# Patient Record
Sex: Male | Born: 1975 | Race: Black or African American | Hispanic: No | Marital: Single | Smoking: Never smoker
Health system: Southern US, Community
[De-identification: ages and names within clinical notes are randomized; demographics above are authoritative.]

---

## 2013-08-24 ENCOUNTER — Encounter (HOSPITAL_COMMUNITY): Payer: Self-pay | Admitting: Emergency Medicine

## 2013-08-24 ENCOUNTER — Emergency Department (INDEPENDENT_AMBULATORY_CARE_PROVIDER_SITE_OTHER): Payer: BC Managed Care – PPO

## 2013-08-24 ENCOUNTER — Emergency Department (HOSPITAL_COMMUNITY)
Admission: EM | Admit: 2013-08-24 | Discharge: 2013-08-24 | Disposition: A | Discharge status: Home / Self Care | Payer: BC Managed Care – PPO | Source: Home / Self Care | Attending: Family Medicine | Admitting: Family Medicine

## 2013-08-24 DIAGNOSIS — M549 Dorsalgia, unspecified: Secondary | ICD-10-CM

## 2013-08-24 LAB — POCT URINALYSIS DIP (DEVICE)
BILIRUBIN URINE: NEGATIVE
Glucose, UA: NEGATIVE mg/dL
KETONES UR: NEGATIVE mg/dL
LEUKOCYTES UA: NEGATIVE
Nitrite: NEGATIVE
PH: 5.5 (ref 5.0–8.0)
Protein, ur: NEGATIVE mg/dL
Specific Gravity, Urine: 1.015 (ref 1.005–1.030)
Urobilinogen, UA: 0.2 mg/dL (ref 0.0–1.0)

## 2013-08-24 MED ORDER — IBUPROFEN 800 MG PO TABS: 800.0000 mg | ORAL_TABLET | Freq: Three times a day (TID) | ORAL | Status: AC

## 2013-08-24 MED ORDER — KETOROLAC TROMETHAMINE 60 MG/2ML IM SOLN
60.0000 mg | Freq: Once | INTRAMUSCULAR | Status: AC
  Administered 2013-08-24: 60 mg via INTRAMUSCULAR

## 2013-08-24 MED ORDER — CYCLOBENZAPRINE HCL 10 MG PO TABS: 10.0000 mg | ORAL_TABLET | Freq: Two times a day (BID) | ORAL | Status: AC | PRN

## 2013-08-24 MED ORDER — KETOROLAC TROMETHAMINE 60 MG/2ML IM SOLN
INTRAMUSCULAR | Status: AC
  Filled 2013-08-24: qty 2

## 2013-08-24 MED ORDER — HYDROCODONE-ACETAMINOPHEN 5-325 MG PO TABS: 2.0000 | ORAL_TABLET | ORAL | Status: AC | PRN

## 2013-08-24 NOTE — ED Notes (Signed)
C/o  Lower back pain since Wednesday.  Denies any known injury and urinary symptoms.  Pt has tried massages with no relief.

## 2013-08-24 NOTE — ED Provider Notes (Signed)
CSN: [id number]     Arrival date & time 08/24/13  1159 History   First MD Initiated Contact with Patient 08/24/13 1321     Chief Complaint  Patient presents with  . Back Pain   (Consider location/radiation/quality/duration/timing/severity/associated sxs/prior Treatment) Patient is a 38 y.o. male presenting with back pain. The history is provided by the patient. No language interpreter was used.  Back Pain Location:  Generalized Quality:  Aching Pain severity:  Moderate Pain is:  Same all the time Duration:  6 days Timing:  Constant Progression:  Worsening Chronicity:  New Relieved by:  Nothing Worsened by:  Nothing tried Ineffective treatments:  None tried Associated symptoms: no abdominal pain     History reviewed. No pertinent past medical history. History reviewed. No pertinent past surgical history. History reviewed. No pertinent family history. History  Substance Use Topics  . Smoking status: Never Smoker   . Smokeless tobacco: Not on file  . Alcohol Use: No    Review of Systems  Gastrointestinal: Negative for abdominal pain.  Musculoskeletal: Positive for back pain.  All other systems reviewed and are negative.   Allergies  Review of patient's allergies indicates no known allergies.  Home Medications   Prior to Admission medications   Medication Sig Start Date End Date Taking? Authorizing Provider  lisinopril-hydrochlorothiazide (PRINZIDE,ZESTORETIC) 20-12.5 MG per tablet Take 1 tablet by mouth daily.   Yes Historical Provider, MD   BP 150/112  Pulse 79  Temp(Src) 98.7 F (37.1 C) (Oral)  Resp 12  SpO2 100% Physical Exam  Constitutional: He appears well-developed and well-nourished.  HENT:  Head: Normocephalic.  Left Ear: External ear normal.  Eyes: Pupils are equal, round, and reactive to light.  Neck: Normal range of motion.  Cardiovascular: Normal rate.   Pulmonary/Chest: Effort normal.  Abdominal: Soft.  Musculoskeletal: Normal range of  motion.  Neurological: He is alert.  Skin: Skin is warm.    ED Course  Procedures (including critical care time) Labs Review Labs Reviewed  POCT URINALYSIS DIP (DEVICE) - Abnormal; Notable for the following:    Hgb urine dipstick TRACE (*)    All other components within normal limits    Imaging Review Dg Lumbar Spine Complete  08/24/2013   CLINICAL DATA:  Lower back pain.  No known injury.  EXAM: LUMBAR SPINE - COMPLETE 4+ VIEW  COMPARISON:  None.  FINDINGS: There is no evidence of lumbar spine fracture. Alignment is normal. Intervertebral disc spaces are maintained. Minimal spurring surrounding the T10-11 disc space as well involving the anterior superior and anterior inferior aspect of the L4 vertebra.  IMPRESSION: Alignment is normal.  Intervertebral disc spaces are maintained.  Minimal spurring surrounding the T10-11 disc space as well involving the anterior superior and anterior inferior aspect of the L4 vertebra.   Electronically Signed   By: Bridgett Larsson M.D.   On: 08/24/2013 14:23     MDM   1. Back pain    Ibuprofen, flexeril. Hydrocodone    Elson Areas, PA-C 08/24/13 1520

## 2013-08-24 NOTE — Discharge Instructions (Signed)
Back Pain, Adult Low back pain is very common. About 1 in 5 people have back pain.The cause of low back pain is rarely dangerous. The pain often gets better over time.About half of people with a sudden onset of back pain feel better in just 2 weeks. About 8 in 10 people feel better by 6 weeks.  CAUSES Some common causes of back pain include:  Strain of the muscles or ligaments supporting the spine.  Wear and tear (degeneration) of the spinal discs.  Arthritis.  Direct injury to the back. DIAGNOSIS Most of the time, the direct cause of low back pain is not known.However, back pain can be treated effectively even when the exact cause of the pain is unknown.Answering your caregiver's questions about your overall health and symptoms is one of the most accurate ways to make sure the cause of your pain is not dangerous. If your caregiver needs more information, he or she may order lab work or imaging tests (X-rays or MRIs).However, even if imaging tests show changes in your back, this usually does not require surgery. HOME CARE INSTRUCTIONS For many people, back pain returns.Since low back pain is rarely dangerous, it is often a condition that people can learn to manageon their own.   Remain active. It is stressful on the back to sit or stand in one place. Do not sit, drive, or stand in one place for more than 30 minutes at a time. Take short walks on level surfaces as soon as pain allows.Try to increase the length of time you walk each day.  Do not stay in bed.Resting more than 1 or 2 days can delay your recovery.  Do not avoid exercise or work.Your body is made to move.It is not dangerous to be active, even though your back may hurt.Your back will likely heal faster if you return to being active before your pain is gone.  Pay attention to your body when you bend and lift. Many people have less discomfortwhen lifting if they bend their knees, keep the load close to their bodies,and  avoid twisting. Often, the most comfortable positions are those that put less stress on your recovering back.  Find a comfortable position to sleep. Use a firm mattress and lie on your side with your knees slightly bent. If you lie on your back, put a pillow under your knees.  Only take over-the-counter or prescription medicines as directed by your caregiver. Over-the-counter medicines to reduce pain and inflammation are often the most helpful.Your caregiver may prescribe muscle relaxant drugs.These medicines help dull your pain so you can more quickly return to your normal activities and healthy exercise.  Put ice on the injured area.  Put ice in a plastic bag.  Place a towel between your skin and the bag.  Leave the ice on for 15-20 minutes, 03-04 times a day for the first 2 to 3 days. After that, ice and heat may be alternated to reduce pain and spasms.  Ask your caregiver about trying back exercises and gentle massage. This may be of some benefit.  Avoid feeling anxious or stressed.Stress increases muscle tension and can worsen back pain.It is important to recognize when you are anxious or stressed and learn ways to manage it.Exercise is a great option. SEEK MEDICAL CARE IF:  You have pain that is not relieved with rest or medicine.  You have pain that does not improve in 1 week.  You have new symptoms.  You are generally not feeling well. SEEK   IMMEDIATE MEDICAL CARE IF:   You have pain that radiates from your back into your legs.  You develop new bowel or bladder control problems.  You have unusual weakness or numbness in your arms or legs.  You develop nausea or vomiting.  You develop abdominal pain.  You feel faint. Document Released: 03/05/2005 Document Revised: 09/04/2011 Document Reviewed: 07/24/2010 [employer] Patient Information 2014 [employer].  

## 2013-08-25 NOTE — ED Provider Notes (Signed)
Medical screening examination/treatment/procedure(s) were performed by non-physician practitioner and as supervising physician I was immediately available for consultation/collaboration.  Gennavieve Huq, M.D.  Dejanique Ruehl C Ethyn Schetter, MD 08/25/13 0737 

## 2015-02-19 IMAGING — CR DG LUMBAR SPINE COMPLETE 4+V
5 series · 5 of 5 positions shown · non-contrast
Comparison: None.

CLINICAL DATA: Lower back pain.  No known injury.

EXAM:
LUMBAR SPINE - COMPLETE 4+ VIEW

[view not recorded (1 of 5)]
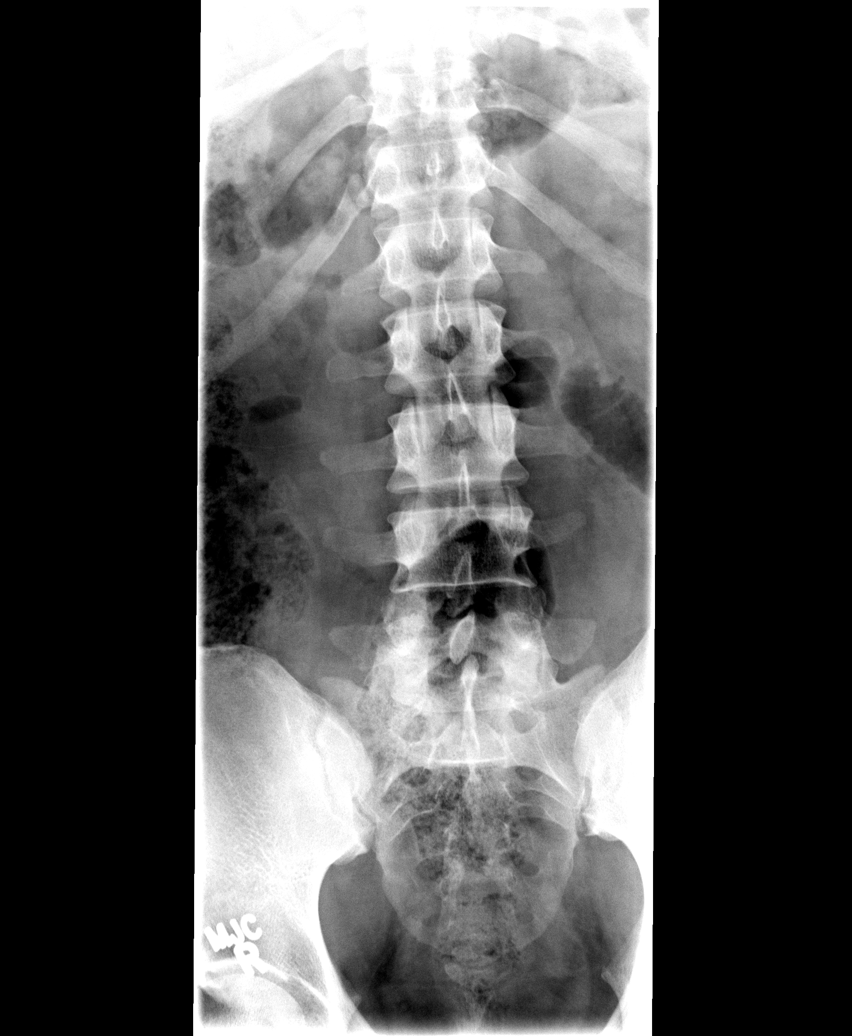

[view not recorded (2 of 5)]
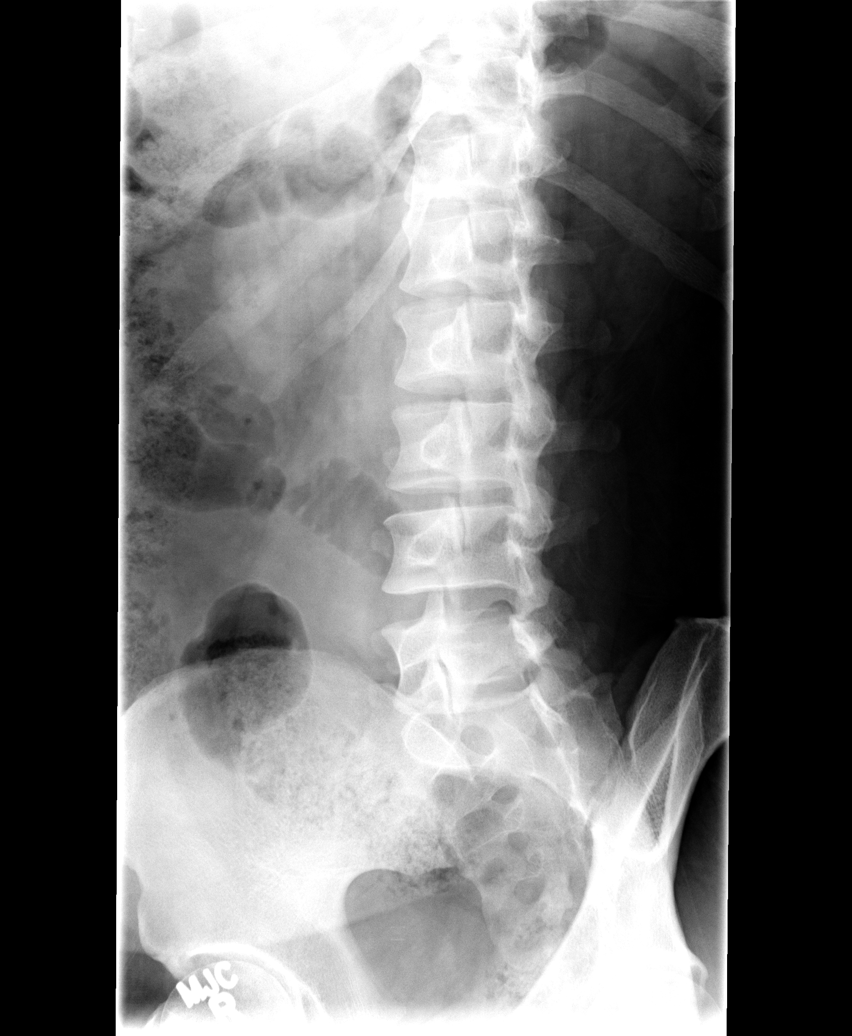

[view not recorded (3 of 5)]
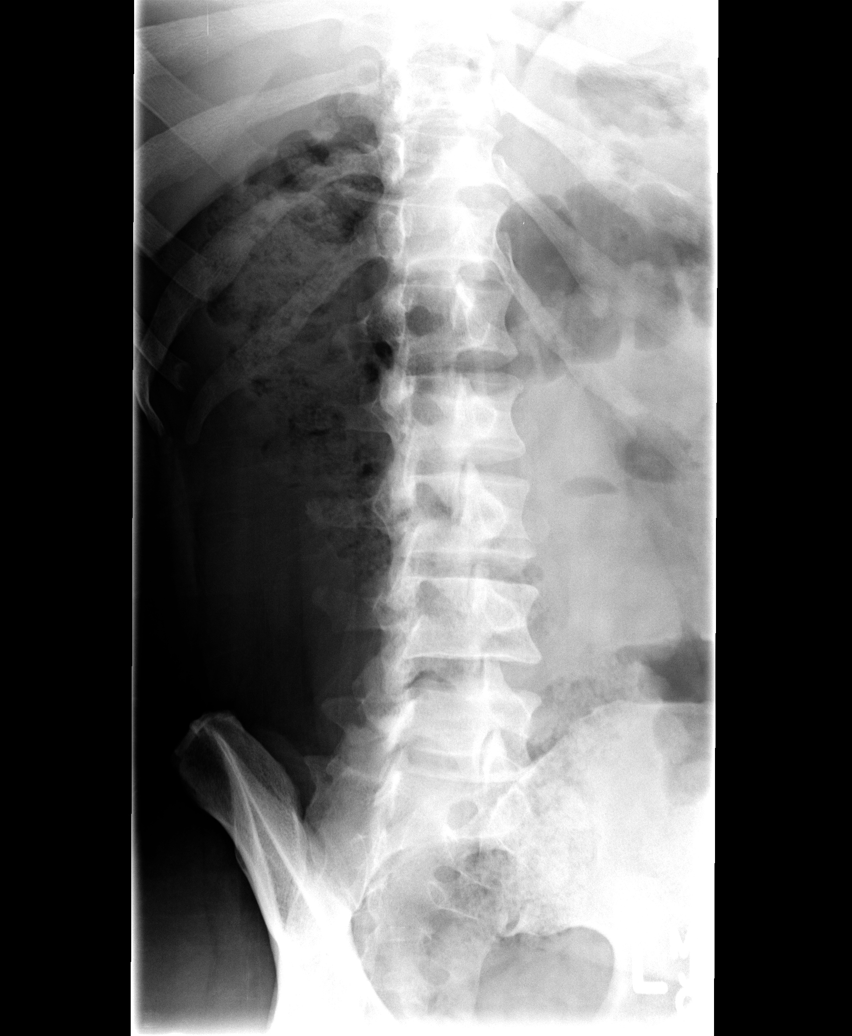

[view not recorded (4 of 5)]
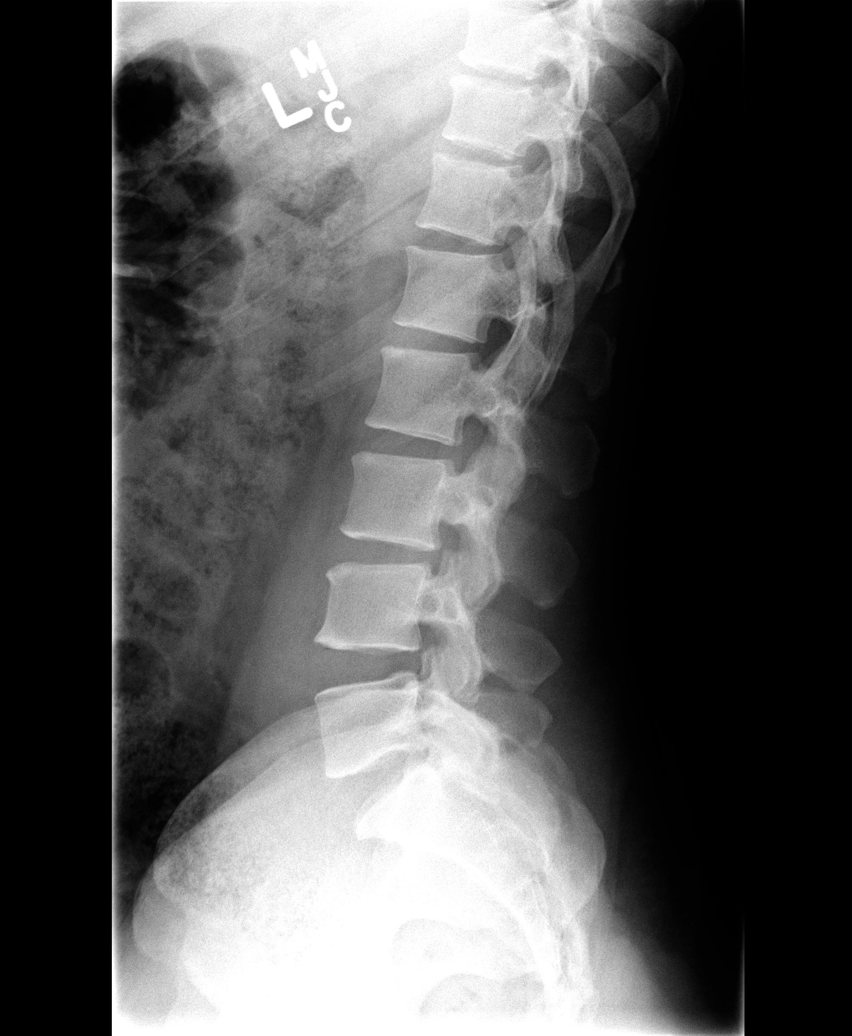

[view not recorded (5 of 5)]
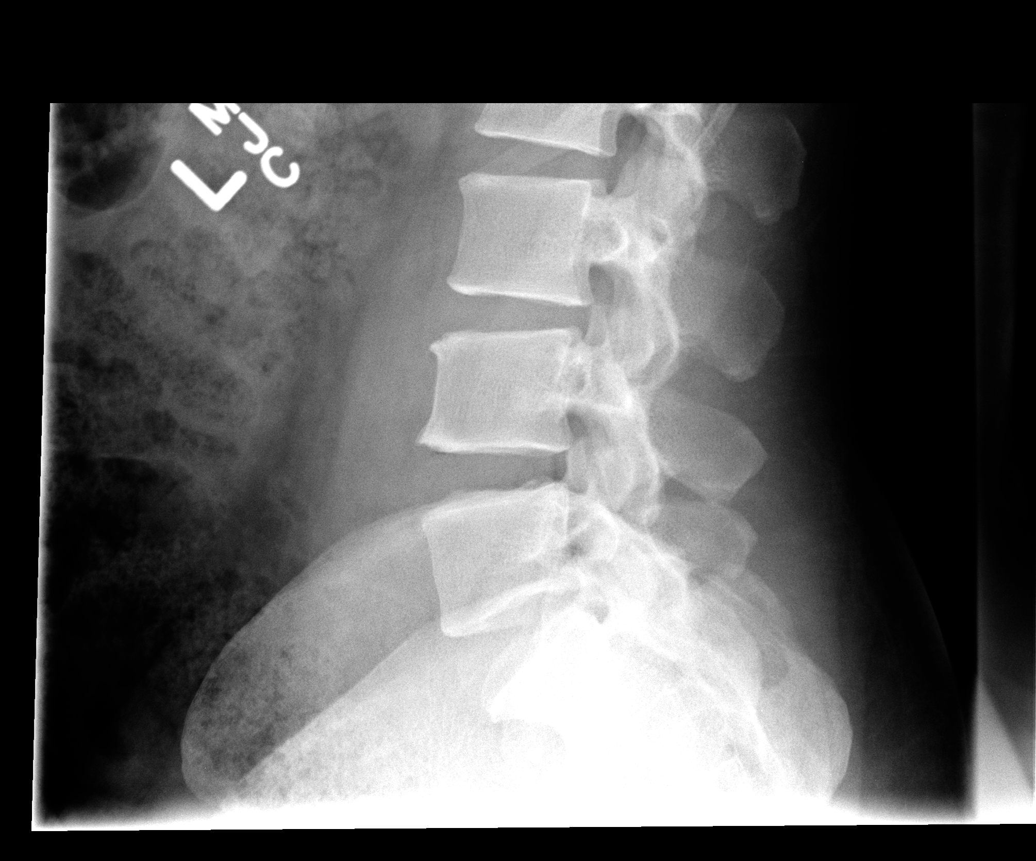

[5 of 5 positions shown; findings below may reference images not displayed]

FINDINGS: There is no evidence of lumbar spine fracture. Alignment is normal.
Intervertebral disc spaces are maintained. Minimal spurring
surrounding the T10-11 disc space as well involving the anterior
superior and anterior inferior aspect of the L4 vertebra.
IMPRESSION: Alignment is normal.  Intervertebral disc spaces are maintained.

Minimal spurring surrounding the T10-11 disc space as well involving
the anterior superior and anterior inferior aspect of the L4
vertebra.
